# Patient Record
Sex: Female | Born: 1985 | Race: White | Hispanic: No | Marital: Single | Smoking: Current every day smoker
Health system: Southern US, Community
[De-identification: ages and names within clinical notes are randomized; demographics above are authoritative.]

---

## 2017-05-31 ENCOUNTER — Encounter (HOSPITAL_COMMUNITY): Payer: Self-pay | Admitting: Emergency Medicine

## 2017-05-31 ENCOUNTER — Emergency Department (HOSPITAL_COMMUNITY): Payer: No Typology Code available for payment source

## 2017-05-31 ENCOUNTER — Inpatient Hospital Stay (HOSPITAL_COMMUNITY)
Admission: EM | Admit: 2017-05-31 | Discharge: 2017-06-06 | DRG: 964 | Disposition: A | Discharge status: Home / Self Care | Payer: No Typology Code available for payment source | Attending: Surgery | Admitting: Surgery

## 2017-05-31 DIAGNOSIS — S2249XA Multiple fractures of ribs, unspecified side, initial encounter for closed fracture: Secondary | ICD-10-CM

## 2017-05-31 DIAGNOSIS — S27321A Contusion of lung, unilateral, initial encounter: Secondary | ICD-10-CM | POA: Diagnosis present

## 2017-05-31 DIAGNOSIS — F17210 Nicotine dependence, cigarettes, uncomplicated: Secondary | ICD-10-CM | POA: Diagnosis present

## 2017-05-31 DIAGNOSIS — Z6839 Body mass index (BMI) 39.0-39.9, adult: Secondary | ICD-10-CM

## 2017-05-31 DIAGNOSIS — S36113A Laceration of liver, unspecified degree, initial encounter: Secondary | ICD-10-CM

## 2017-05-31 DIAGNOSIS — S270XXA Traumatic pneumothorax, initial encounter: Secondary | ICD-10-CM | POA: Diagnosis present

## 2017-05-31 DIAGNOSIS — S36116A Major laceration of liver, initial encounter: Principal | ICD-10-CM | POA: Diagnosis present

## 2017-05-31 DIAGNOSIS — J942 Hemothorax: Secondary | ICD-10-CM

## 2017-05-31 DIAGNOSIS — M25511 Pain in right shoulder: Secondary | ICD-10-CM | POA: Diagnosis present

## 2017-05-31 DIAGNOSIS — E669 Obesity, unspecified: Secondary | ICD-10-CM | POA: Diagnosis present

## 2017-05-31 DIAGNOSIS — S36420A Contusion of duodenum, initial encounter: Secondary | ICD-10-CM | POA: Diagnosis present

## 2017-05-31 DIAGNOSIS — Y92410 Unspecified street and highway as the place of occurrence of the external cause: Secondary | ICD-10-CM

## 2017-05-31 DIAGNOSIS — R413 Other amnesia: Secondary | ICD-10-CM | POA: Diagnosis present

## 2017-05-31 DIAGNOSIS — D72829 Elevated white blood cell count, unspecified: Secondary | ICD-10-CM | POA: Diagnosis present

## 2017-05-31 DIAGNOSIS — S2241XA Multiple fractures of ribs, right side, initial encounter for closed fracture: Secondary | ICD-10-CM | POA: Diagnosis present

## 2017-05-31 DIAGNOSIS — Z4682 Encounter for fitting and adjustment of non-vascular catheter: Secondary | ICD-10-CM

## 2017-05-31 DIAGNOSIS — T797XXA Traumatic subcutaneous emphysema, initial encounter: Secondary | ICD-10-CM | POA: Diagnosis present

## 2017-05-31 DIAGNOSIS — T07XXXA Unspecified multiple injuries, initial encounter: Secondary | ICD-10-CM | POA: Diagnosis present

## 2017-05-31 DIAGNOSIS — M79642 Pain in left hand: Secondary | ICD-10-CM | POA: Diagnosis present

## 2017-05-31 DIAGNOSIS — D62 Acute posthemorrhagic anemia: Secondary | ICD-10-CM | POA: Diagnosis present

## 2017-05-31 LAB — I-STAT CHEM 8, ED
BUN: 15 mg/dL (ref 6–20)
CHLORIDE: 108 mmol/L (ref 101–111)
Calcium, Ion: 1.11 mmol/L — ABNORMAL LOW (ref 1.15–1.40)
Creatinine, Ser: 0.6 mg/dL (ref 0.44–1.00)
GLUCOSE: 175 mg/dL — AB (ref 65–99)
HCT: 43 % (ref 36.0–46.0)
HEMOGLOBIN: 14.6 g/dL (ref 12.0–15.0)
POTASSIUM: 3.6 mmol/L (ref 3.5–5.1)
SODIUM: 140 mmol/L (ref 135–145)
TCO2: 22 mmol/L (ref 22–32)

## 2017-05-31 LAB — I-STAT CG4 LACTIC ACID, ED: LACTIC ACID, VENOUS: 2.99 mmol/L — AB (ref 0.5–1.9)

## 2017-05-31 MED ORDER — SODIUM CHLORIDE 0.9 % IV BOLUS
1000.0000 mL | Freq: Once | INTRAVENOUS | Status: AC
  Administered 2017-05-31: 1000 mL via INTRAVENOUS

## 2017-05-31 MED ORDER — SODIUM CHLORIDE 0.9 % IV BOLUS
1000.0000 mL | Freq: Once | INTRAVENOUS | Status: AC
  Administered 2017-06-01: 1000 mL via INTRAVENOUS

## 2017-05-31 MED ORDER — HYDROMORPHONE HCL 2 MG/ML IJ SOLN
1.0000 mg | Freq: Once | INTRAMUSCULAR | Status: AC
  Administered 2017-05-31: 1 mg via INTRAVENOUS
  Filled 2017-05-31: qty 1

## 2017-05-31 MED ORDER — SODIUM CHLORIDE 0.9 % IV SOLN
INTRAVENOUS | Status: DC
  Administered 2017-06-01: 02:00:00 via INTRAVENOUS
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
## 2017-06-01 ENCOUNTER — Emergency Department (HOSPITAL_COMMUNITY): Payer: No Typology Code available for payment source

## 2017-06-01 ENCOUNTER — Inpatient Hospital Stay (HOSPITAL_COMMUNITY): Payer: No Typology Code available for payment source

## 2017-06-01 DIAGNOSIS — T07XXXA Unspecified multiple injuries, initial encounter: Secondary | ICD-10-CM | POA: Diagnosis present

## 2017-06-01 DIAGNOSIS — M79642 Pain in left hand: Secondary | ICD-10-CM | POA: Diagnosis present

## 2017-06-01 DIAGNOSIS — S2241XA Multiple fractures of ribs, right side, initial encounter for closed fracture: Secondary | ICD-10-CM | POA: Diagnosis present

## 2017-06-01 DIAGNOSIS — T797XXA Traumatic subcutaneous emphysema, initial encounter: Secondary | ICD-10-CM | POA: Diagnosis present

## 2017-06-01 DIAGNOSIS — Z6839 Body mass index (BMI) 39.0-39.9, adult: Secondary | ICD-10-CM | POA: Diagnosis not present

## 2017-06-01 DIAGNOSIS — S2249XA Multiple fractures of ribs, unspecified side, initial encounter for closed fracture: Secondary | ICD-10-CM | POA: Diagnosis present

## 2017-06-01 DIAGNOSIS — M25511 Pain in right shoulder: Secondary | ICD-10-CM | POA: Diagnosis present

## 2017-06-01 DIAGNOSIS — S36116A Major laceration of liver, initial encounter: Secondary | ICD-10-CM | POA: Diagnosis present

## 2017-06-01 DIAGNOSIS — S36420A Contusion of duodenum, initial encounter: Secondary | ICD-10-CM | POA: Diagnosis present

## 2017-06-01 DIAGNOSIS — S270XXA Traumatic pneumothorax, initial encounter: Secondary | ICD-10-CM | POA: Diagnosis present

## 2017-06-01 DIAGNOSIS — F17210 Nicotine dependence, cigarettes, uncomplicated: Secondary | ICD-10-CM | POA: Diagnosis present

## 2017-06-01 DIAGNOSIS — R413 Other amnesia: Secondary | ICD-10-CM | POA: Diagnosis present

## 2017-06-01 DIAGNOSIS — E669 Obesity, unspecified: Secondary | ICD-10-CM | POA: Diagnosis present

## 2017-06-01 DIAGNOSIS — D62 Acute posthemorrhagic anemia: Secondary | ICD-10-CM | POA: Diagnosis present

## 2017-06-01 DIAGNOSIS — D72829 Elevated white blood cell count, unspecified: Secondary | ICD-10-CM | POA: Diagnosis present

## 2017-06-01 DIAGNOSIS — Y92410 Unspecified street and highway as the place of occurrence of the external cause: Secondary | ICD-10-CM | POA: Diagnosis not present

## 2017-06-01 DIAGNOSIS — S27321A Contusion of lung, unilateral, initial encounter: Secondary | ICD-10-CM | POA: Diagnosis present

## 2017-06-01 LAB — CBC
HCT: 42.7 % (ref 36.0–46.0)
HEMATOCRIT: 37 % (ref 36.0–46.0)
HEMATOCRIT: 38 % (ref 36.0–46.0)
Hemoglobin: 12.3 g/dL (ref 12.0–15.0)
Hemoglobin: 12.4 g/dL (ref 12.0–15.0)
Hemoglobin: 14.6 g/dL (ref 12.0–15.0)
MCH: 29.7 pg (ref 26.0–34.0)
MCH: 30.1 pg (ref 26.0–34.0)
MCH: 30.7 pg (ref 26.0–34.0)
MCHC: 32.6 g/dL (ref 30.0–36.0)
MCHC: 33.2 g/dL (ref 30.0–36.0)
MCHC: 34.2 g/dL (ref 30.0–36.0)
MCV: 89.7 fL (ref 78.0–100.0)
MCV: 90.7 fL (ref 78.0–100.0)
MCV: 91.1 fL (ref 78.0–100.0)
PLATELETS: 389 10*3/uL (ref 150–400)
Platelets: 271 10*3/uL (ref 150–400)
Platelets: 306 10*3/uL (ref 150–400)
RBC: 4.08 MIL/uL (ref 3.87–5.11)
RBC: 4.17 MIL/uL (ref 3.87–5.11)
RBC: 4.76 MIL/uL (ref 3.87–5.11)
RDW: 12.6 % (ref 11.5–15.5)
RDW: 12.8 % (ref 11.5–15.5)
RDW: 13.1 % (ref 11.5–15.5)
WBC: 18.8 10*3/uL — AB (ref 4.0–10.5)
WBC: 22 10*3/uL — ABNORMAL HIGH (ref 4.0–10.5)
WBC: 38.1 10*3/uL — ABNORMAL HIGH (ref 4.0–10.5)

## 2017-06-01 LAB — MRSA PCR SCREENING: MRSA by PCR: NEGATIVE

## 2017-06-01 LAB — BASIC METABOLIC PANEL
ANION GAP: 9 (ref 5–15)
BUN: 14 mg/dL (ref 6–20)
CO2: 19 mmol/L — AB (ref 22–32)
CREATININE: 0.67 mg/dL (ref 0.44–1.00)
Calcium: 7.8 mg/dL — ABNORMAL LOW (ref 8.9–10.3)
Chloride: 109 mmol/L (ref 101–111)
GFR calc Af Amer: >60 mL/min (ref >60)
GFR calc non Af Amer: >60 mL/min (ref >60)
GLUCOSE: 148 mg/dL — AB (ref 65–99)
Potassium: 4.4 mmol/L (ref 3.5–5.1)
Sodium: 137 mmol/L (ref 135–145)

## 2017-06-01 LAB — I-STAT TROPONIN, ED: Troponin i, poc: 0.07 ng/mL (ref 0.00–0.08)

## 2017-06-01 LAB — COMPREHENSIVE METABOLIC PANEL
ALK PHOS: 65 U/L (ref 38–126)
ALT: 500 U/L — AB (ref 14–54)
AST: 402 U/L — ABNORMAL HIGH (ref 15–41)
Albumin: 3.5 g/dL (ref 3.5–5.0)
Anion gap: 11 (ref 5–15)
BUN: 12 mg/dL (ref 6–20)
CALCIUM: 8.6 mg/dL — AB (ref 8.9–10.3)
CO2: 19 mmol/L — AB (ref 22–32)
CREATININE: 0.75 mg/dL (ref 0.44–1.00)
Chloride: 107 mmol/L (ref 101–111)
Glucose, Bld: 174 mg/dL — ABNORMAL HIGH (ref 65–99)
Potassium: 3.6 mmol/L (ref 3.5–5.1)
Sodium: 137 mmol/L (ref 135–145)
Total Bilirubin: 0.5 mg/dL (ref 0.3–1.2)
Total Protein: 6.5 g/dL (ref 6.5–8.1)

## 2017-06-01 LAB — LACTIC ACID, PLASMA: Lactic Acid, Venous: 1.3 mmol/L (ref 0.5–1.9)

## 2017-06-01 LAB — PROTIME-INR
INR: 0.95
Prothrombin Time: 12.6 seconds (ref 11.4–15.2)

## 2017-06-01 LAB — ETHANOL

## 2017-06-01 LAB — I-STAT BETA HCG BLOOD, ED (MC, WL, AP ONLY)

## 2017-06-01 LAB — SAMPLE TO BLOOD BANK

## 2017-06-01 LAB — HIV ANTIBODY (ROUTINE TESTING W REFLEX): HIV Screen 4th Generation wRfx: NONREACTIVE

## 2017-06-01 MED ORDER — SODIUM CHLORIDE 0.9% FLUSH: 9.0000 mL | INTRAVENOUS | Status: DC | PRN

## 2017-06-01 MED ORDER — KETAMINE HCL 10 MG/ML IJ SOLN
0.3000 mg/kg | Freq: Once | INTRAMUSCULAR | Status: AC
  Administered 2017-06-01: 29 mg via INTRAVENOUS
  Filled 2017-06-01: qty 1

## 2017-06-01 MED ORDER — HYDROMORPHONE 1 MG/ML IV SOLN
INTRAVENOUS | Status: DC
  Administered 2017-06-01: 4.2 mg via INTRAVENOUS
  Administered 2017-06-01: 05:00:00 via INTRAVENOUS
  Administered 2017-06-02: 2.4 mg via INTRAVENOUS
  Administered 2017-06-02: 2.7 mg via INTRAVENOUS
  Filled 2017-06-01: qty 25

## 2017-06-01 MED ORDER — HYDROMORPHONE HCL 2 MG/ML IJ SOLN
1.0000 mg | INTRAMUSCULAR | Status: AC
  Administered 2017-06-01: 1 mg via INTRAVENOUS
  Filled 2017-06-01: qty 1

## 2017-06-01 MED ORDER — IOPAMIDOL (ISOVUE-300) INJECTION 61%
INTRAVENOUS | Status: AC
  Filled 2017-06-01: qty 100

## 2017-06-01 MED ORDER — DIPHENHYDRAMINE HCL 50 MG/ML IJ SOLN: 12.5000 mg | Freq: Four times a day (QID) | INTRAMUSCULAR | Status: DC | PRN

## 2017-06-01 MED ORDER — ORAL CARE MOUTH RINSE
15.0000 mL | Freq: Two times a day (BID) | OROMUCOSAL | Status: DC
  Administered 2017-06-01 – 2017-06-06 (×4): 15 mL via OROMUCOSAL

## 2017-06-01 MED ORDER — MIDAZOLAM HCL 2 MG/2ML IJ SOLN
2.0000 mg | INTRAMUSCULAR | Status: AC
  Administered 2017-06-01: 2 mg via INTRAVENOUS

## 2017-06-01 MED ORDER — OXYCODONE HCL 5 MG PO TABS
5.0000 mg | ORAL_TABLET | ORAL | Status: DC | PRN
  Administered 2017-06-01: 5 mg via ORAL
  Filled 2017-06-01: qty 1

## 2017-06-01 MED ORDER — LIDOCAINE HCL (PF) 1 % IJ SOLN
INTRAMUSCULAR | Status: AC
  Filled 2017-06-01: qty 30

## 2017-06-01 MED ORDER — DIPHENHYDRAMINE HCL 50 MG/ML IJ SOLN
25.0000 mg | Freq: Four times a day (QID) | INTRAMUSCULAR | Status: DC | PRN
  Filled 2017-06-01: qty 1

## 2017-06-01 MED ORDER — IOPAMIDOL (ISOVUE-300) INJECTION 61%
100.0000 mL | Freq: Once | INTRAVENOUS | Status: AC | PRN
  Administered 2017-06-01: 100 mL via INTRAVENOUS

## 2017-06-01 MED ORDER — KETAMINE HCL 10 MG/ML IJ SOLN
0.3000 mg/kg | INTRAMUSCULAR | Status: AC
  Administered 2017-06-01: 29 mg via INTRAVENOUS

## 2017-06-01 MED ORDER — ACETAMINOPHEN 325 MG PO TABS
650.0000 mg | ORAL_TABLET | Freq: Four times a day (QID) | ORAL | Status: DC
  Administered 2017-06-01 – 2017-06-06 (×23): 650 mg via ORAL
  Filled 2017-06-01 (×23): qty 2

## 2017-06-01 MED ORDER — TRAMADOL HCL 50 MG PO TABS
50.0000 mg | ORAL_TABLET | Freq: Four times a day (QID) | ORAL | Status: DC
  Administered 2017-06-01 – 2017-06-02 (×5): 50 mg via ORAL
  Filled 2017-06-01 (×5): qty 1

## 2017-06-01 MED ORDER — MIDAZOLAM HCL 2 MG/2ML IJ SOLN
INTRAMUSCULAR | Status: AC
  Administered 2017-06-01: 03:00:00
  Filled 2017-06-01: qty 6

## 2017-06-01 MED ORDER — DIPHENHYDRAMINE HCL 12.5 MG/5ML PO ELIX
25.0000 mg | ORAL_SOLUTION | Freq: Four times a day (QID) | ORAL | Status: DC | PRN
  Administered 2017-06-01 – 2017-06-05 (×5): 25 mg via ORAL
  Filled 2017-06-01 (×5): qty 10

## 2017-06-01 MED ORDER — ONDANSETRON HCL 4 MG/2ML IJ SOLN
4.0000 mg | Freq: Four times a day (QID) | INTRAMUSCULAR | Status: DC | PRN
  Filled 2017-06-01: qty 2

## 2017-06-01 MED ORDER — METHOCARBAMOL 1000 MG/10ML IJ SOLN
1000.0000 mg | Freq: Three times a day (TID) | INTRAMUSCULAR | Status: DC | PRN
  Administered 2017-06-03: 1000 mg via INTRAVENOUS
  Filled 2017-06-01 (×3): qty 10

## 2017-06-01 MED ORDER — DIPHENHYDRAMINE HCL 12.5 MG/5ML PO ELIX
12.5000 mg | ORAL_SOLUTION | Freq: Four times a day (QID) | ORAL | Status: DC | PRN
  Filled 2017-06-01: qty 5

## 2017-06-01 MED ORDER — POTASSIUM CHLORIDE IN NACL 20-0.9 MEQ/L-% IV SOLN
INTRAVENOUS | Status: DC
  Administered 2017-06-01 – 2017-06-03 (×6): via INTRAVENOUS
  Filled 2017-06-01 (×6): qty 1000

## 2017-06-01 MED ORDER — NALOXONE HCL 0.4 MG/ML IJ SOLN: 0.4000 mg | INTRAMUSCULAR | Status: DC | PRN

## 2017-06-01 MED ORDER — ONDANSETRON 4 MG PO TBDP: 4.0000 mg | ORAL_TABLET | Freq: Four times a day (QID) | ORAL | Status: DC | PRN

## 2017-06-01 MED ORDER — MORPHINE SULFATE (PF) 4 MG/ML IV SOLN
4.0000 mg | INTRAVENOUS | Status: DC | PRN
  Administered 2017-06-01 – 2017-06-05 (×18): 4 mg via INTRAVENOUS
  Filled 2017-06-01 (×19): qty 1

## 2017-06-01 MED ORDER — ONDANSETRON HCL 4 MG/2ML IJ SOLN
4.0000 mg | Freq: Four times a day (QID) | INTRAMUSCULAR | Status: DC | PRN
  Administered 2017-06-05: 4 mg via INTRAVENOUS
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
## 2017-06-01 NOTE — ED Notes (Signed)
CT notified that pt ready for transport
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
## 2017-06-01 NOTE — H&P (Addendum)
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 anesthetized the skin with lidocaine on the right side chest just below the breast in the mid axillary line.  I made incision with a scalpel.  I then dissected down to the ribs.  I used a hemostat to gain entrance into the thoracic cavity.  I then placed a 20 French chest tube into the chest.  There was a small rush of air as well as some blood.  The tube was sutured in place.  An occlusive dressing was applied and the tube was placed to a Pleur-evac.
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
## 2017-06-01 NOTE — ED Notes (Signed)
Patient has subcutaneous emphysema right chest/breast.

## 2017-06-01 NOTE — Progress Notes (Signed)
Pt HR in 140s-150s and only complains of pain 8/10 at the moment, no other complaints. PRN pain medications given. MD made aware and no new orders received at this time. RN will continue to assess and monitor closely.

## 2017-06-01 NOTE — ED Notes (Signed)
Nurse collected labs. 

## 2017-06-01 NOTE — Plan of Care (Signed)
Pt practices IS hourly and is showing improvement
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
## 2017-06-02 ENCOUNTER — Inpatient Hospital Stay (HOSPITAL_COMMUNITY): Payer: No Typology Code available for payment source

## 2017-06-02 LAB — CBC
HCT: 31.9 % — ABNORMAL LOW (ref 36.0–46.0)
HEMATOCRIT: 32.7 % — AB (ref 36.0–46.0)
HEMOGLOBIN: 10.9 g/dL — AB (ref 12.0–15.0)
Hemoglobin: 10.5 g/dL — ABNORMAL LOW (ref 12.0–15.0)
MCH: 30.4 pg (ref 26.0–34.0)
MCH: 29.8 pg (ref 26.0–34.0)
MCHC: 33.3 g/dL (ref 30.0–36.0)
MCHC: 32.9 g/dL (ref 30.0–36.0)
MCV: 91.3 fL (ref 78.0–100.0)
MCV: 90.6 fL (ref 78.0–100.0)
PLATELETS: 207 10*3/uL (ref 150–400)
Platelets: 232 10*3/uL (ref 150–400)
RBC: 3.58 MIL/uL — ABNORMAL LOW (ref 3.87–5.11)
RBC: 3.52 MIL/uL — AB (ref 3.87–5.11)
RDW: 13.5 % (ref 11.5–15.5)
RDW: 12.8 % (ref 11.5–15.5)
WBC: 15.9 10*3/uL — ABNORMAL HIGH (ref 4.0–10.5)
WBC: 13.4 10*3/uL — ABNORMAL HIGH (ref 4.0–10.5)

## 2017-06-02 LAB — BASIC METABOLIC PANEL
Anion gap: 7 (ref 5–15)
BUN: 8 mg/dL (ref 6–20)
CHLORIDE: 105 mmol/L (ref 101–111)
CO2: 20 mmol/L — ABNORMAL LOW (ref 22–32)
CREATININE: 0.49 mg/dL (ref 0.44–1.00)
Calcium: 7.7 mg/dL — ABNORMAL LOW (ref 8.9–10.3)
GFR calc Af Amer: >60 mL/min (ref >60)
GFR calc non Af Amer: >60 mL/min (ref >60)
Glucose, Bld: 113 mg/dL — ABNORMAL HIGH (ref 65–99)
Potassium: 3.9 mmol/L (ref 3.5–5.1)
Sodium: 132 mmol/L — ABNORMAL LOW (ref 135–145)

## 2017-06-02 MED ORDER — TRAMADOL HCL 50 MG PO TABS
100.0000 mg | ORAL_TABLET | Freq: Four times a day (QID) | ORAL | Status: DC
  Administered 2017-06-02 – 2017-06-06 (×17): 100 mg via ORAL
  Filled 2017-06-02 (×18): qty 2

## 2017-06-02 MED ORDER — OXYCODONE HCL 5 MG PO TABS
10.0000 mg | ORAL_TABLET | ORAL | Status: DC | PRN
  Administered 2017-06-02 (×2): 15 mg via ORAL
  Administered 2017-06-03 (×2): 10 mg via ORAL
  Administered 2017-06-03 – 2017-06-04 (×3): 15 mg via ORAL
  Administered 2017-06-04: 10 mg via ORAL
  Administered 2017-06-06: 15 mg via ORAL
  Filled 2017-06-02: qty 2
  Filled 2017-06-02 (×4): qty 3
  Filled 2017-06-02: qty 2
  Filled 2017-06-02: qty 3
  Filled 2017-06-02: qty 2
  Filled 2017-06-02: qty 3

## 2017-06-02 MED ORDER — HYDROMORPHONE HCL 1 MG/ML IJ SOLN
1.0000 mg | INTRAMUSCULAR | Status: DC | PRN
  Administered 2017-06-02 – 2017-06-05 (×9): 1 mg via INTRAVENOUS
  Filled 2017-06-02 (×10): qty 1
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
## 2017-06-03 ENCOUNTER — Inpatient Hospital Stay (HOSPITAL_COMMUNITY): Payer: No Typology Code available for payment source

## 2017-06-03 LAB — BASIC METABOLIC PANEL
Anion gap: 5 (ref 5–15)
CALCIUM: 7.8 mg/dL — AB (ref 8.9–10.3)
CO2: 25 mmol/L (ref 22–32)
CREATININE: 0.41 mg/dL — AB (ref 0.44–1.00)
Chloride: 106 mmol/L (ref 101–111)
GFR calc Af Amer: >60 mL/min (ref >60)
Glucose, Bld: 96 mg/dL (ref 65–99)
Potassium: 3.7 mmol/L (ref 3.5–5.1)
Sodium: 136 mmol/L (ref 135–145)

## 2017-06-03 LAB — CBC
HCT: 29.7 % — ABNORMAL LOW (ref 36.0–46.0)
Hemoglobin: 9.9 g/dL — ABNORMAL LOW (ref 12.0–15.0)
MCH: 30.1 pg (ref 26.0–34.0)
MCHC: 33.3 g/dL (ref 30.0–36.0)
MCV: 90.3 fL (ref 78.0–100.0)
PLATELETS: 191 10*3/uL (ref 150–400)
RBC: 3.29 MIL/uL — ABNORMAL LOW (ref 3.87–5.11)
RDW: 12.9 % (ref 11.5–15.5)
WBC: 11.4 10*3/uL — AB (ref 4.0–10.5)
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
## 2017-06-04 ENCOUNTER — Other Ambulatory Visit: Payer: Self-pay

## 2017-06-04 ENCOUNTER — Inpatient Hospital Stay (HOSPITAL_COMMUNITY): Payer: No Typology Code available for payment source

## 2017-06-04 LAB — CBC
HEMATOCRIT: 30.7 % — AB (ref 36.0–46.0)
HEMOGLOBIN: 10.3 g/dL — AB (ref 12.0–15.0)
MCH: 29.8 pg (ref 26.0–34.0)
MCHC: 33.6 g/dL (ref 30.0–36.0)
MCV: 88.7 fL (ref 78.0–100.0)
Platelets: 200 10*3/uL (ref 150–400)
RBC: 3.46 MIL/uL — ABNORMAL LOW (ref 3.87–5.11)
RDW: 12.7 % (ref 11.5–15.5)
WBC: 8.3 10*3/uL (ref 4.0–10.5)

## 2017-06-04 MED ORDER — DOCUSATE SODIUM 100 MG PO CAPS
100.0000 mg | ORAL_CAPSULE | Freq: Two times a day (BID) | ORAL | Status: DC
  Administered 2017-06-04 – 2017-06-06 (×5): 100 mg via ORAL
  Filled 2017-06-04 (×5): qty 1

## 2017-06-04 MED ORDER — METHOCARBAMOL 500 MG PO TABS
1000.0000 mg | ORAL_TABLET | Freq: Four times a day (QID) | ORAL | Status: DC
  Administered 2017-06-04 – 2017-06-06 (×11): 1000 mg via ORAL
  Filled 2017-06-04 (×11): qty 2

## 2017-06-04 MED ORDER — POLYETHYLENE GLYCOL 3350 17 G PO PACK
17.0000 g | PACK | Freq: Every day | ORAL | Status: DC
  Administered 2017-06-04 – 2017-06-06 (×3): 17 g via ORAL
  Filled 2017-06-04 (×3): qty 1

## 2017-06-04 MED ORDER — IBUPROFEN 200 MG PO TABS
600.0000 mg | ORAL_TABLET | Freq: Three times a day (TID) | ORAL | Status: DC
  Administered 2017-06-04 – 2017-06-06 (×8): 600 mg via ORAL
  Filled 2017-06-04 (×8): qty 3

## 2017-06-04 NOTE — Progress Notes (Signed)
Patient's mother FMLA papers placed in chart to be filled out.
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
## 2017-06-04 NOTE — Progress Notes (Signed)
Patient with poor appetite, ensures given at meal times. Continue to encourage food intake.
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
## 2017-06-05 ENCOUNTER — Inpatient Hospital Stay (HOSPITAL_COMMUNITY): Payer: No Typology Code available for payment source

## 2017-06-05 LAB — BASIC METABOLIC PANEL
Anion gap: 9 (ref 5–15)
BUN: 6 mg/dL (ref 6–20)
CHLORIDE: 102 mmol/L (ref 101–111)
CO2: 27 mmol/L (ref 22–32)
Calcium: 8.3 mg/dL — ABNORMAL LOW (ref 8.9–10.3)
Creatinine, Ser: 0.42 mg/dL — ABNORMAL LOW (ref 0.44–1.00)
GFR calc Af Amer: >60 mL/min (ref >60)
GFR calc non Af Amer: >60 mL/min (ref >60)
Glucose, Bld: 79 mg/dL (ref 65–99)
POTASSIUM: 3.1 mmol/L — AB (ref 3.5–5.1)
Sodium: 138 mmol/L (ref 135–145)

## 2017-06-05 MED ORDER — ALBUTEROL SULFATE (2.5 MG/3ML) 0.083% IN NEBU
2.5000 mg | INHALATION_SOLUTION | Freq: Four times a day (QID) | RESPIRATORY_TRACT | Status: DC | PRN
  Administered 2017-06-05: 2.5 mg via RESPIRATORY_TRACT
  Filled 2017-06-05: qty 3
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
## 2017-06-05 NOTE — Progress Notes (Signed)
Dressing around chest tube changed. Pt tolerated well but was experiencing increased pain after. Morphine administered and effective. [employer] Corporationmber Bailey Hill
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
## 2017-06-05 NOTE — Progress Notes (Signed)
Pt in good spirits upon assessment, ambulated in hallway with PT, mom bathed patient in room and then left for work. Visitor in room with patient.  Patient has a very poor appetite and has not had a b.m. Nurse educated patient on various ways to expel feces if patient becomes uncomfortable. Patient denied anything right now. Was given an ENSURE to replace nutrients. Both IVs were lost this a.m. New IV placed.
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
## 2017-06-06 ENCOUNTER — Inpatient Hospital Stay (HOSPITAL_COMMUNITY): Payer: No Typology Code available for payment source

## 2017-06-06 LAB — CBC
HEMATOCRIT: 32.3 % — AB (ref 36.0–46.0)
HEMOGLOBIN: 11 g/dL — AB (ref 12.0–15.0)
MCH: 30.4 pg (ref 26.0–34.0)
MCHC: 34.1 g/dL (ref 30.0–36.0)
MCV: 89.2 fL (ref 78.0–100.0)
Platelets: 254 10*3/uL (ref 150–400)
RBC: 3.62 MIL/uL — AB (ref 3.87–5.11)
RDW: 13.1 % (ref 11.5–15.5)
WBC: 6.9 10*3/uL (ref 4.0–10.5)

## 2017-06-06 MED ORDER — POLYETHYLENE GLYCOL 3350 17 G PO PACK: 17.0000 g | PACK | Freq: Every day | ORAL | 0 refills | Status: AC | PRN

## 2017-06-06 MED ORDER — TRAMADOL HCL 50 MG PO TABS: 100.0000 mg | ORAL_TABLET | Freq: Four times a day (QID) | ORAL | 0 refills | Status: AC | PRN

## 2017-06-06 MED ORDER — OXYCODONE HCL 10 MG PO TABS: 10.0000 mg | ORAL_TABLET | Freq: Four times a day (QID) | ORAL | 0 refills | Status: AC | PRN

## 2017-06-06 MED ORDER — ACETAMINOPHEN 325 MG PO TABS: 650.0000 mg | ORAL_TABLET | Freq: Four times a day (QID) | ORAL | Status: AC

## 2017-06-06 MED ORDER — IBUPROFEN 600 MG PO TABS: 600.0000 mg | ORAL_TABLET | Freq: Three times a day (TID) | ORAL | 0 refills | Status: AC

## 2017-06-06 NOTE — Plan of Care (Signed)
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
## 2017-06-16 ENCOUNTER — Other Ambulatory Visit: Payer: Self-pay | Admitting: Physician Assistant

## 2017-06-16 ENCOUNTER — Ambulatory Visit
Admission: RE | Admit: 2017-06-16 | Discharge: 2017-06-16 | Disposition: A | Discharge status: Home / Self Care | Payer: Self-pay | Source: Ambulatory Visit | Attending: Physician Assistant | Admitting: Physician Assistant

## 2017-06-16 DIAGNOSIS — T07XXXA Unspecified multiple injuries, initial encounter: Secondary | ICD-10-CM
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
